# Patient Record
Sex: Male | Born: 1945 | Race: White | Hispanic: No
Health system: Southern US, Community
[De-identification: ages and names within clinical notes are randomized; demographics above are authoritative.]

## PROBLEM LIST (undated history)

## (undated) DIAGNOSIS — I10 Essential (primary) hypertension: Secondary | ICD-10-CM

## (undated) DIAGNOSIS — G4730 Sleep apnea, unspecified: Secondary | ICD-10-CM

## (undated) DIAGNOSIS — E785 Hyperlipidemia, unspecified: Secondary | ICD-10-CM

## (undated) DIAGNOSIS — E119 Type 2 diabetes mellitus without complications: Secondary | ICD-10-CM

## (undated) DIAGNOSIS — M1990 Unspecified osteoarthritis, unspecified site: Secondary | ICD-10-CM

## (undated) HISTORY — PX: SPINE SURGERY: SHX786

## (undated) HISTORY — DX: Unspecified osteoarthritis, unspecified site: M19.90

## (undated) HISTORY — DX: Sleep apnea, unspecified: G47.30

## (undated) HISTORY — PX: OTHER SURGICAL HISTORY: SHX169

## (undated) HISTORY — DX: Type 2 diabetes mellitus without complications: E11.9

## (undated) HISTORY — DX: Essential (primary) hypertension: I10

## (undated) HISTORY — DX: Hyperlipidemia, unspecified: E78.5

---

## 2002-12-08 HISTORY — PX: NASAL SEPTUM SURGERY: SHX37

## 2011-05-13 ENCOUNTER — Ambulatory Visit: Payer: Self-pay | Admitting: Family Medicine

## 2013-06-14 ENCOUNTER — Ambulatory Visit: Payer: Self-pay | Admitting: Family Medicine

## 2014-10-09 IMAGING — CT CT CHEST W/ CM
1 series · 15 of 32 positions shown, 19 images · non-contrast
Comparison: none

REASON FOR EXAM: Lung Nodule
COMMENTS:

[Series 2: soft tissue · axial · 0.79mm/px · z∈[-652,-318]mm · 15 of 126 slices shown, 19 images]
[im 10/126  mediastinal]
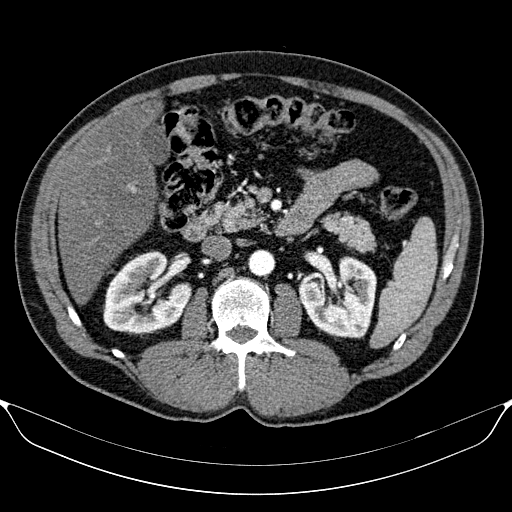
[im 10/126  lung]
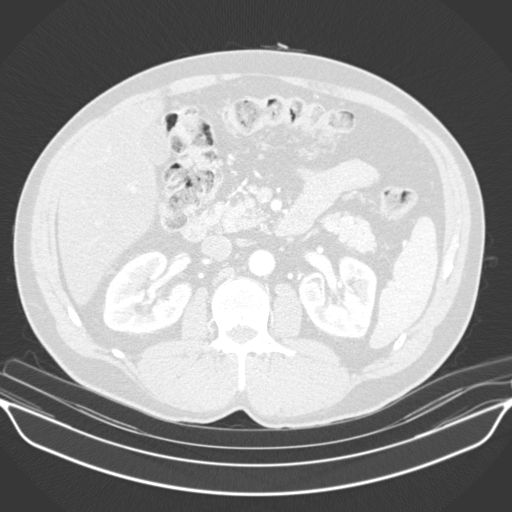
[im 19/126  lung]
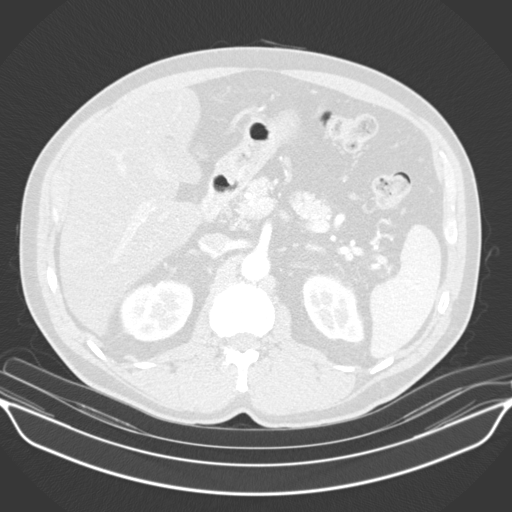
[im 26/126  lung]
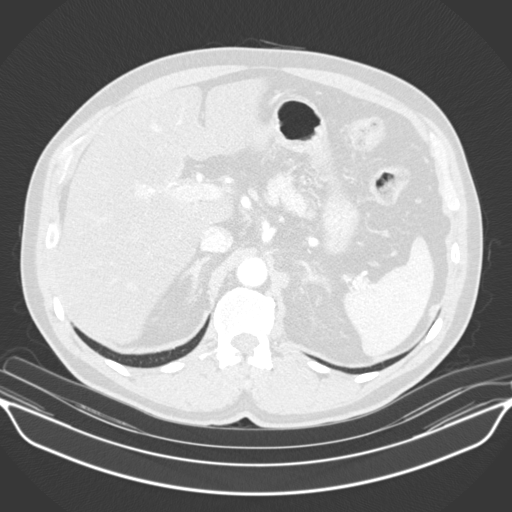
[im 33/126  lung]
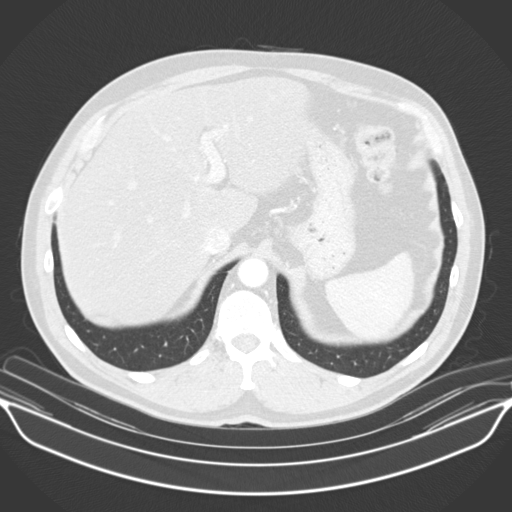
[im 42/126  mediastinal]
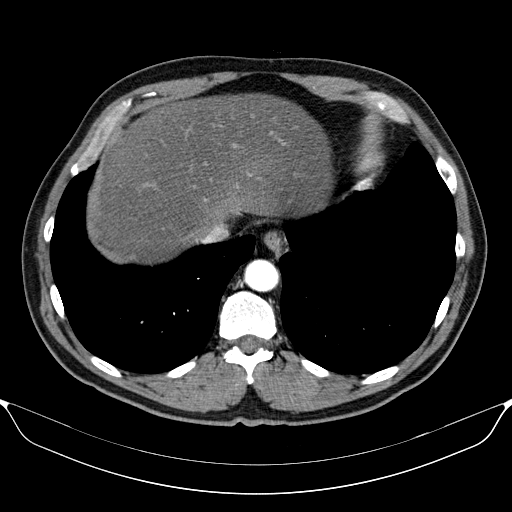
[im 42/126  lung]
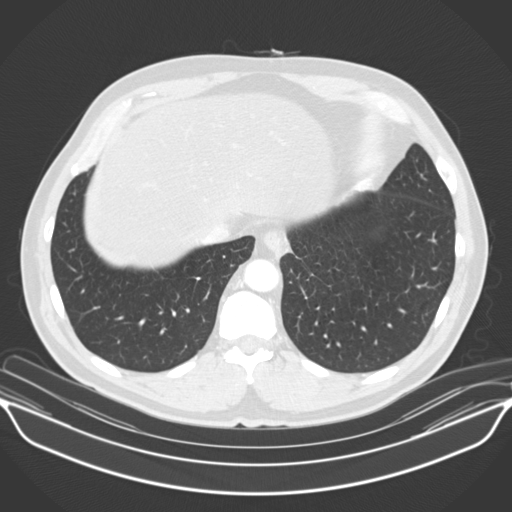
[im 51/126  lung]
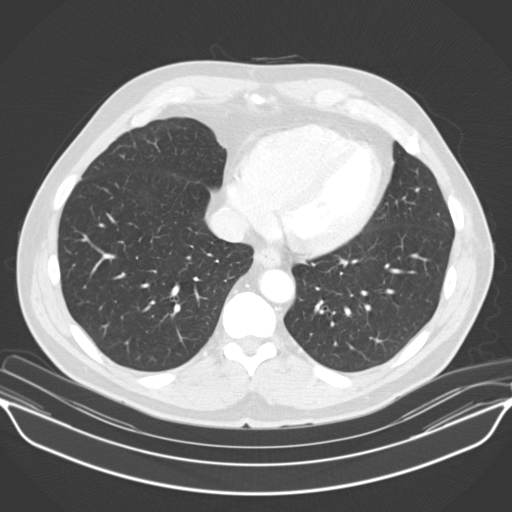
[im 61/126  lung]
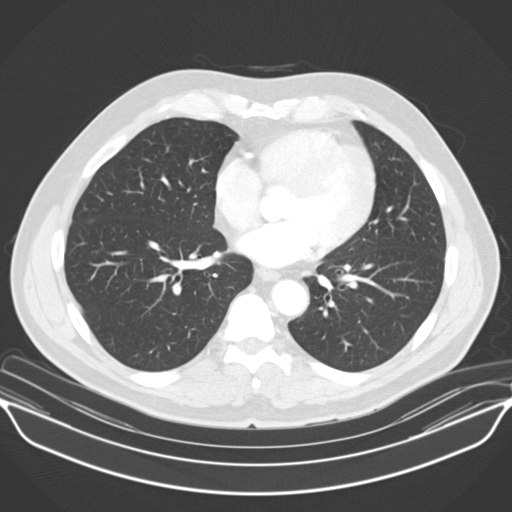
[im 66/126  lung]
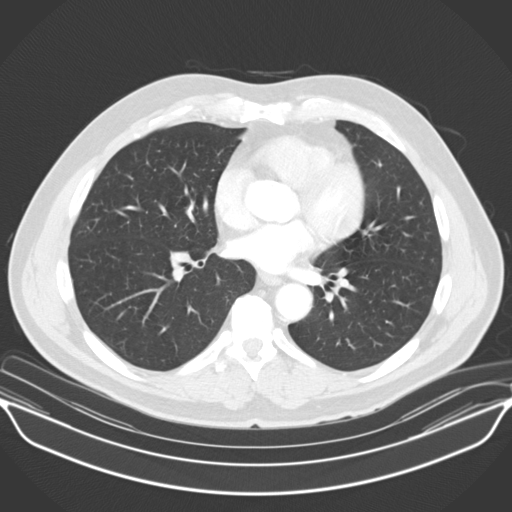
[im 75/126  mediastinal]
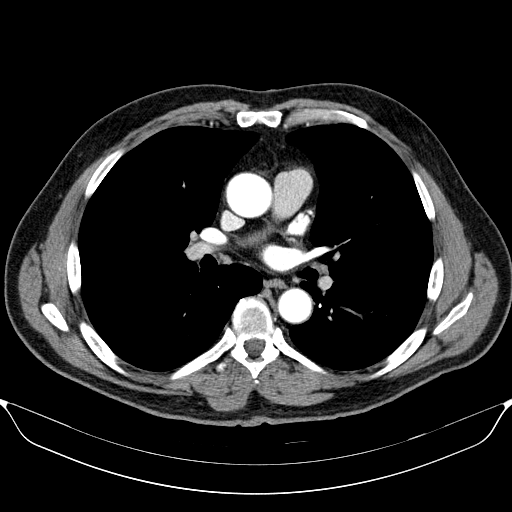
[im 75/126  lung]
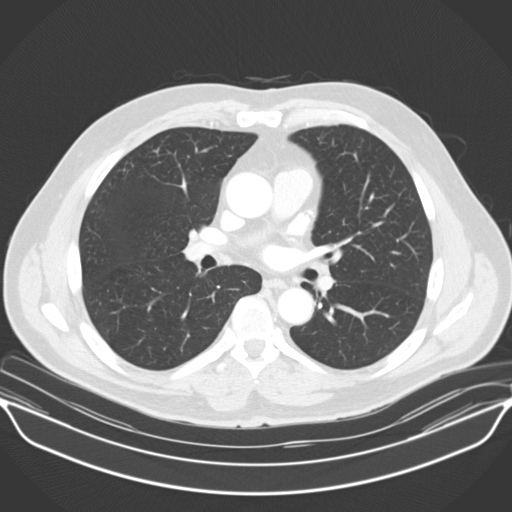
[im 79/126  lung]
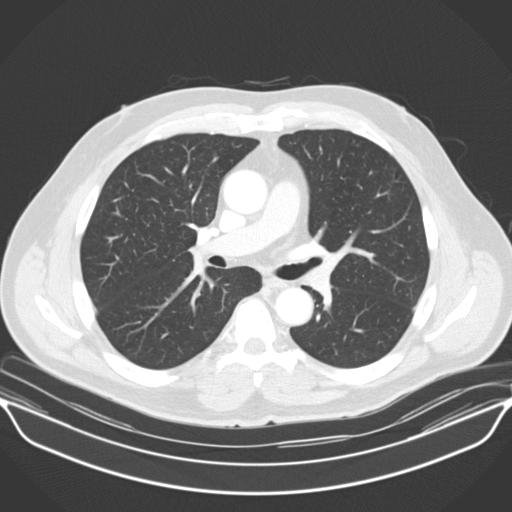
[im 88/126  lung]
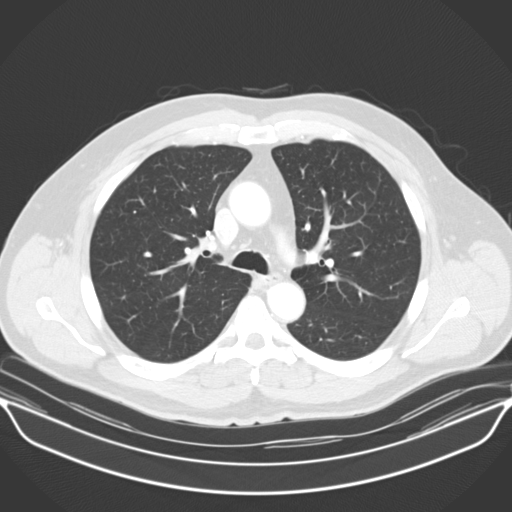
[im 98/126  lung]
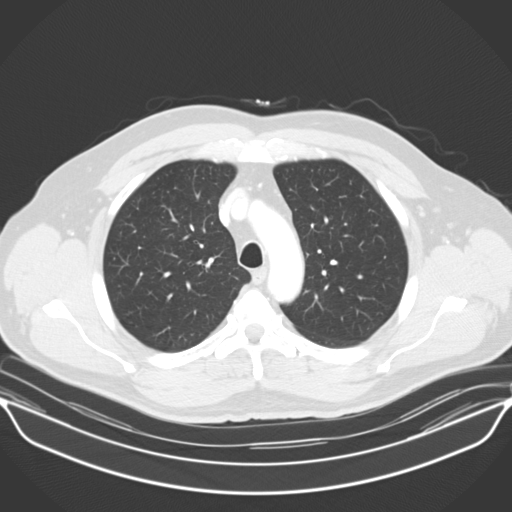
[im 102/126  mediastinal]
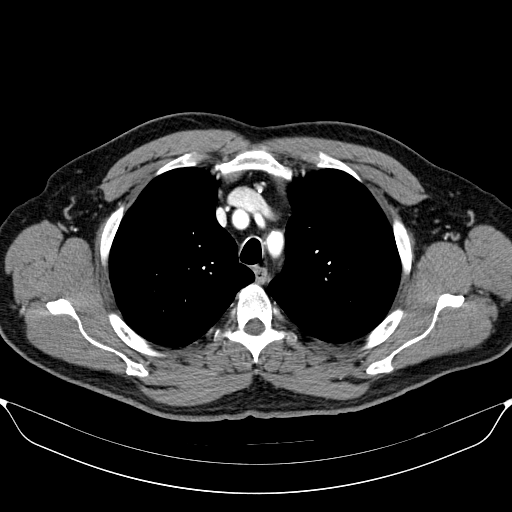
[im 102/126  lung]
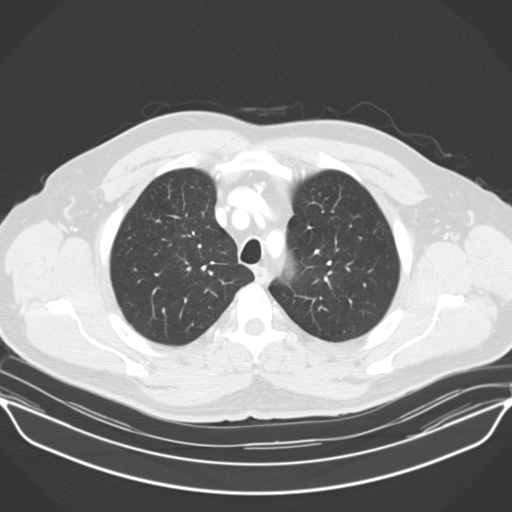
[im 112/126  lung]
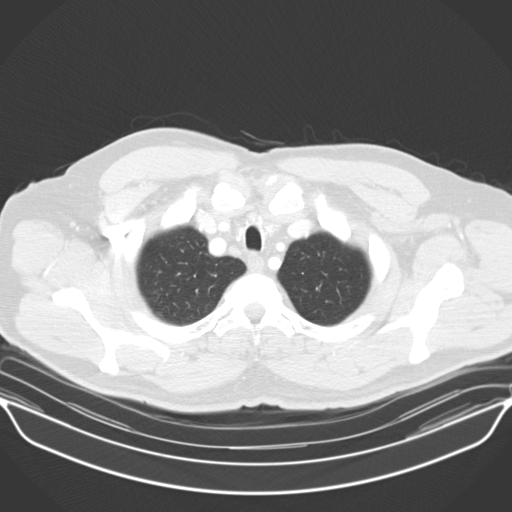
[im 121/126  lung]
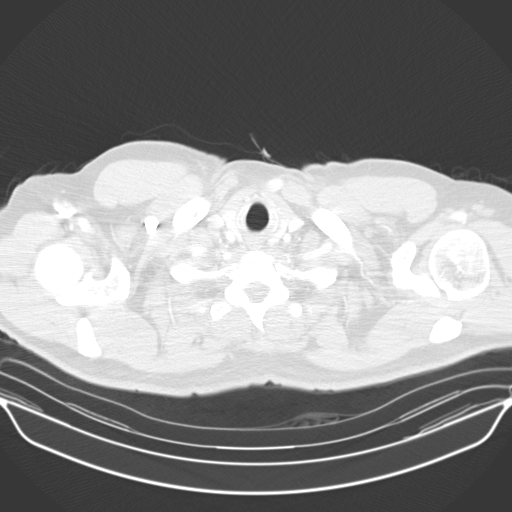

[15 of 32 positions shown; findings below may reference images not displayed]

PROCEDURE:     AL-BANTANI - AL-BANTANI CHEST WITH CONTRAST  - June 14, 2013 [DATE]

RESULT:     Chest CT is performed with 75 mL of [id number] iodinated
intravenous contrast with images reconstructed at 3.0 mm slice thickness in
the axial plane. The patient has no previous CT scans at this institution.
No previous outside studies were submitted for comparison.

There is diffuse fatty infiltration in the included portion of the liver.
The other included upper abdominal structures appear grossly unremarkable.
There is no pleural or pericardial effusion. The heart appears normal in
size. The thoracic aorta is normal in caliber without dissection. There is
scattered minimal atherosclerotic calcification within the aorta. Scattered
coronary artery atherosclerotic calcification is seen especially in the LAD.
There is no mediastinal or hilar mass or evidence of adenopathy. Nonenlarged
lymph nodes are seen in the mediastinum and axillary regions. The bony
structures appear unremarkable. Lung window images demonstrate no focal
pulmonary mass. There is some minimal nodular density peripherally in the
right lower lobe on image 81 of the lung window settings which is
nonspecific and not particularly nodular in appearance. This is also seen on
image 82. This measures up to approximately 3 mm at lung window settings. No
other focal pulmonary lesions are identified.
IMPRESSION: 1. There is minimal nodularity in the right lower lobe. It is uncertain if
this is stable given the lack of previous outside studies for comparison.
2. Fatty infiltration of the liver.

[REDACTED]

## 2015-05-21 ENCOUNTER — Other Ambulatory Visit: Payer: Self-pay | Admitting: Family Medicine

## 2015-05-21 DIAGNOSIS — R945 Abnormal results of liver function studies: Principal | ICD-10-CM

## 2015-05-21 DIAGNOSIS — R7989 Other specified abnormal findings of blood chemistry: Secondary | ICD-10-CM

## 2015-05-25 ENCOUNTER — Ambulatory Visit
Admission: RE | Admit: 2015-05-25 | Discharge: 2015-05-25 | Disposition: A | Discharge status: Home / Self Care | Payer: Medicare Other | Source: Ambulatory Visit | Attending: Family Medicine | Admitting: Family Medicine

## 2015-05-25 DIAGNOSIS — R7989 Other specified abnormal findings of blood chemistry: Secondary | ICD-10-CM | POA: Diagnosis not present

## 2015-05-25 DIAGNOSIS — R945 Abnormal results of liver function studies: Secondary | ICD-10-CM

## 2016-02-17 IMAGING — US US ABDOMEN LIMITED
1 series · 14 of 25 positions shown · non-contrast
Comparison: None.

CLINICAL DATA: Elevated LFTs.

EXAM:
US ABDOMEN LIMITED - RIGHT UPPER QUADRANT

[Series 1: us abdomen limited · 0.20mm/px · 14 of 53 slices shown]
[im 1/53]
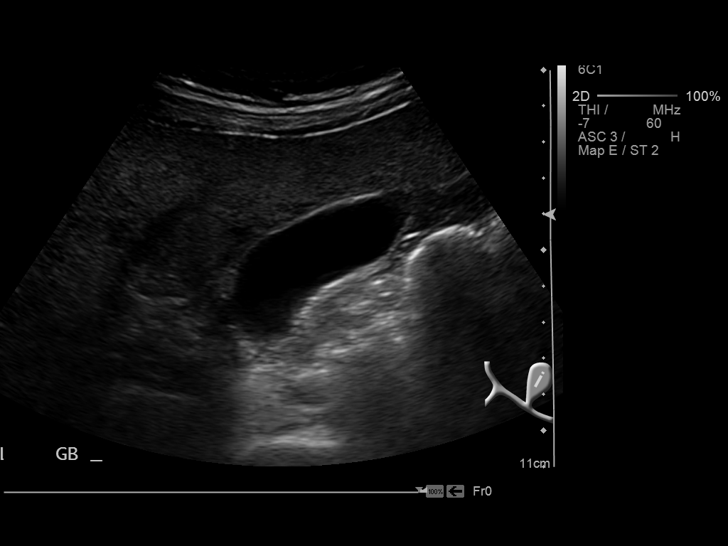
[im 5/53]
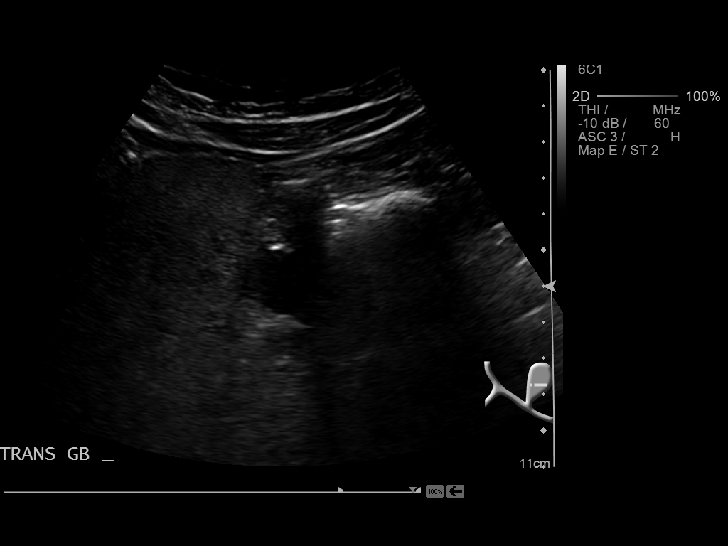
[im 9/53]
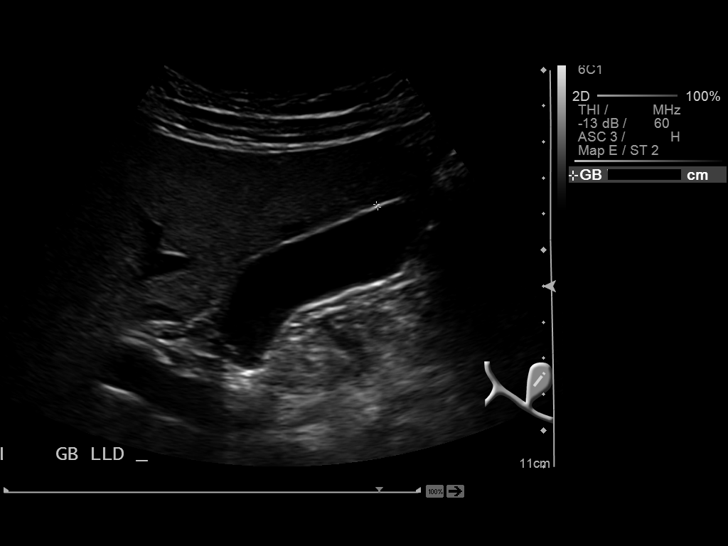
[im 14/53]
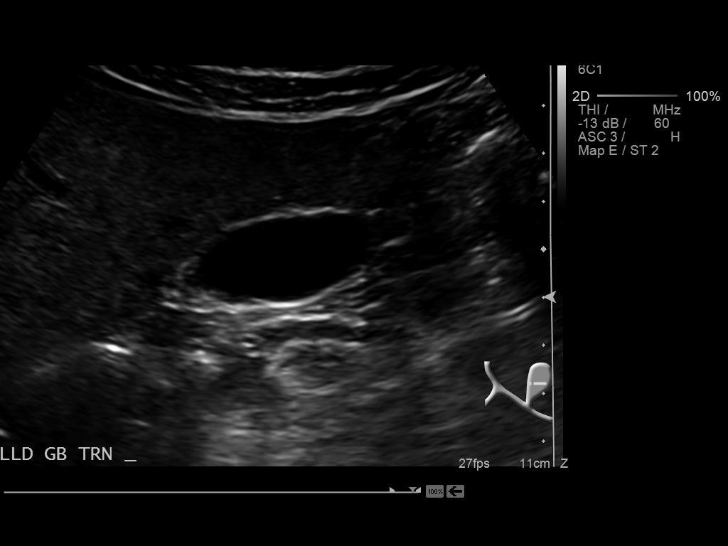
[im 18/53]
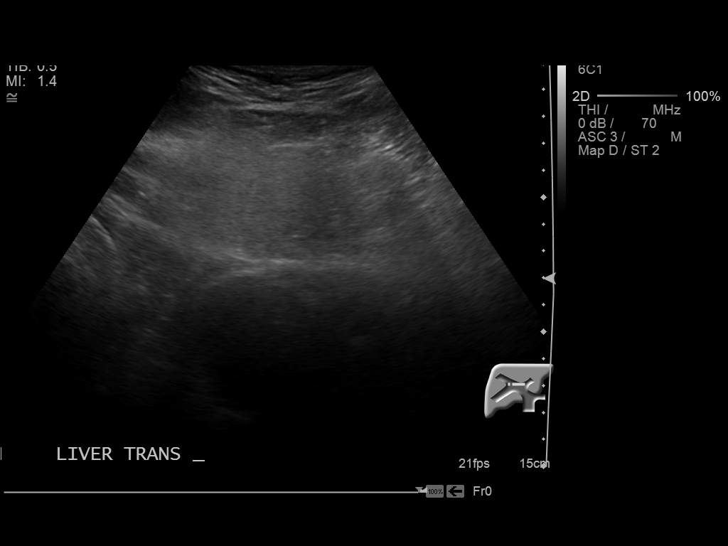
[im 20/53]
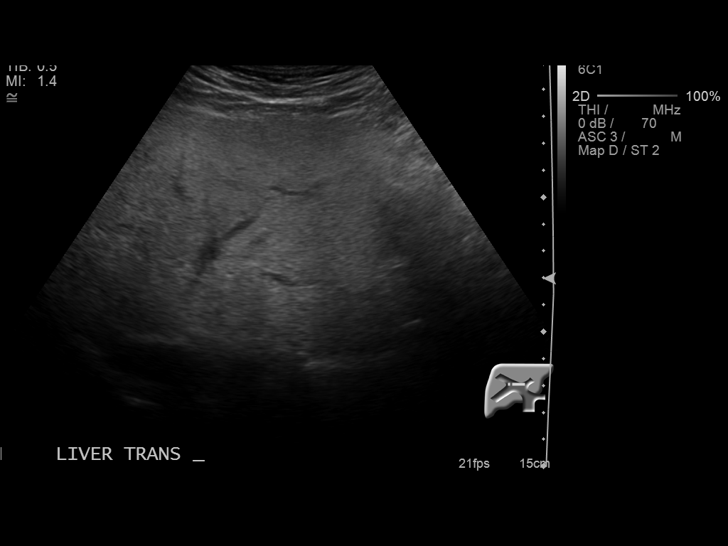
[im 24/53]
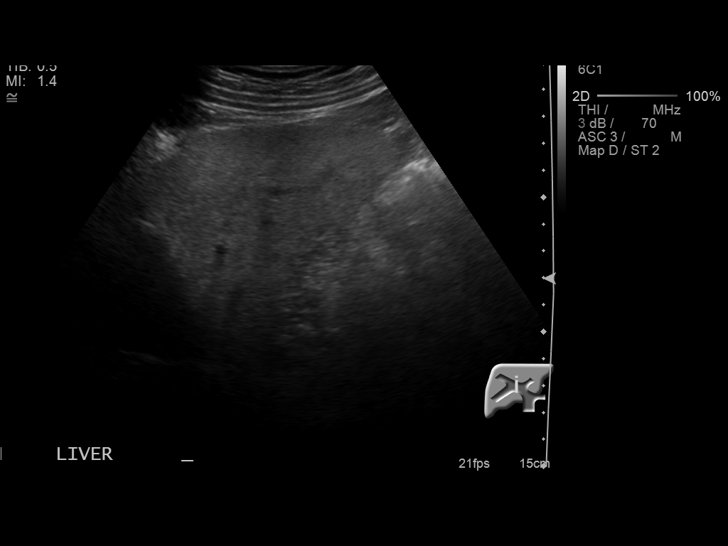
[im 29/53]
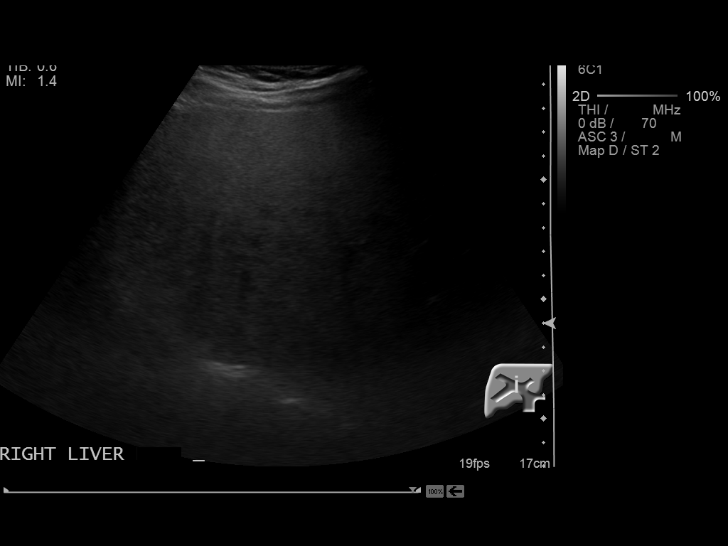
[im 33/53]
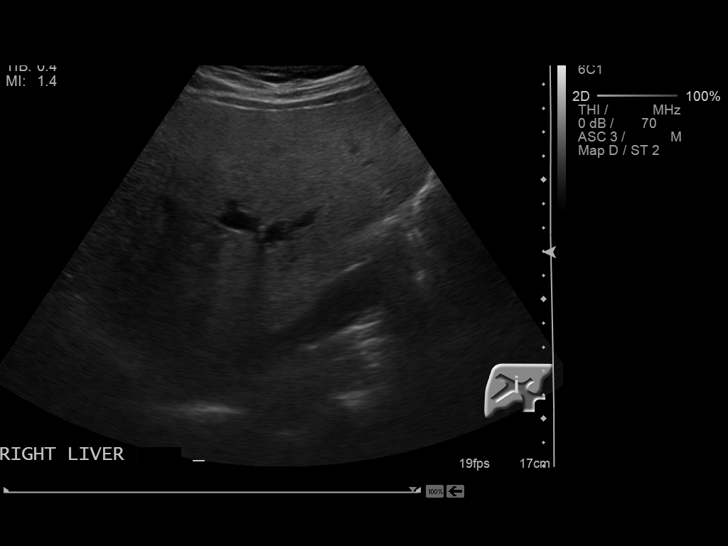
[im 35/53]
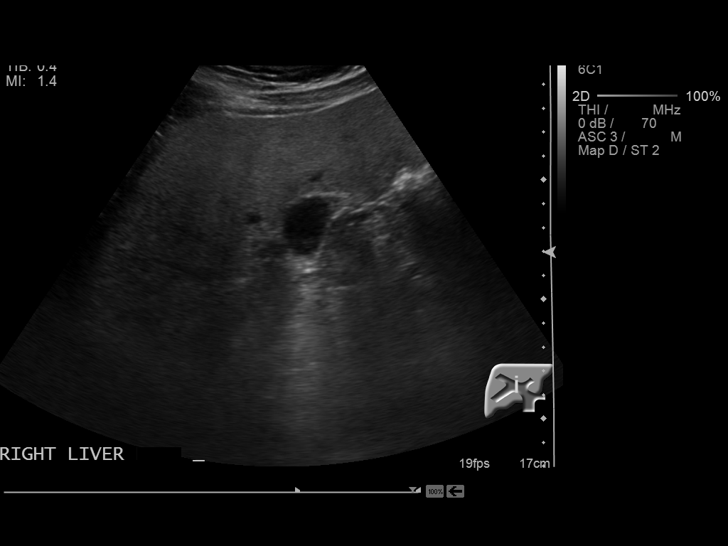
[im 40/53]
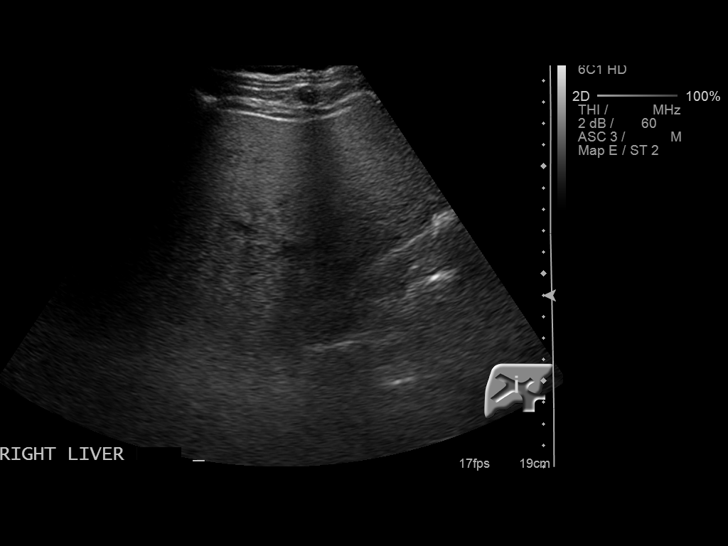
[im 44/53]
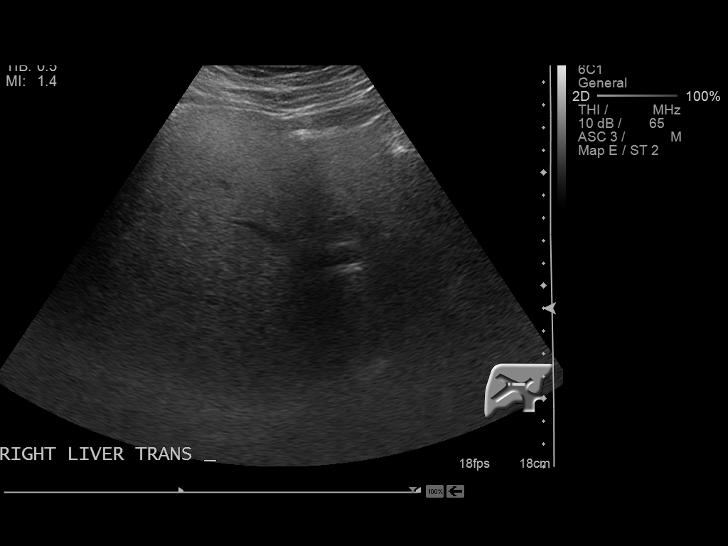
[im 48/53]
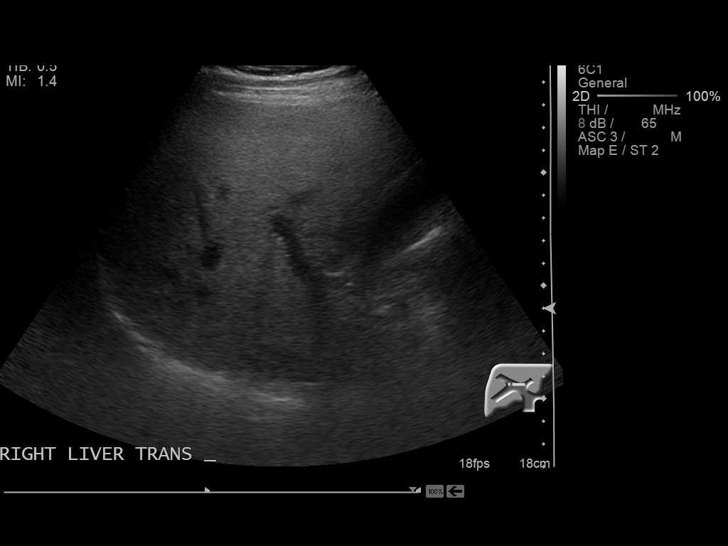
[im 53/53]
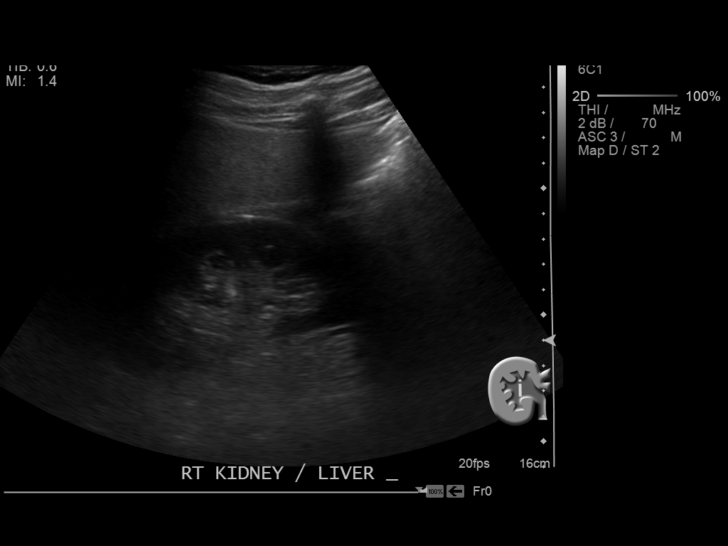

[14 of 25 positions shown; findings below may reference images not displayed]

FINDINGS: Gallbladder:

No gallstones or wall thickening visualized. No sonographic Murphy
sign noted.

Common bile duct:

Diameter: 2.6 mm

Liver:

Liver is echogenic consistent fatty infiltration and/or
hepatocellular disease. No focal hepatic abnormality identified.
IMPRESSION: Liver is echogenic consistent with fatty infiltration and/or
hepatocellular disease.

## 2016-12-15 ENCOUNTER — Encounter: Payer: Medicare Other | Attending: Family Medicine | Admitting: *Deleted

## 2016-12-15 ENCOUNTER — Encounter: Payer: Self-pay | Admitting: *Deleted

## 2016-12-15 VITALS — BP 158/96 | Ht 68.0 in | Wt 204.1 lb

## 2016-12-15 DIAGNOSIS — Z713 Dietary counseling and surveillance: Secondary | ICD-10-CM | POA: Diagnosis not present

## 2016-12-15 DIAGNOSIS — Z6831 Body mass index (BMI) 31.0-31.9, adult: Secondary | ICD-10-CM | POA: Diagnosis not present

## 2016-12-15 DIAGNOSIS — E119 Type 2 diabetes mellitus without complications: Secondary | ICD-10-CM | POA: Insufficient documentation

## 2016-12-15 NOTE — Progress Notes (Signed)
Diabetes Self-Management Education  Visit Type: First/Initial  Appt. Start Time: 0920 Appt. End Time: 1040  12/15/2016  [name], identified by name and date of birth, is a 71 y.o. male with a diagnosis of Diabetes: Type 2.   ASSESSMENT  Blood pressure (!) 158/96, height 5\' 8"  (1.727 m), weight 204 lb 1.6 oz (92.6 kg). Body mass index is 31.03 kg/m.      Diabetes Self-Management Education - 12/15/16 1358      Visit Information   Visit Type First/Initial     Initial Visit   Diabetes Type Type 2   Are you currently following a meal plan? No   Are you taking your medications as prescribed? Yes   Date Diagnosed 2 weeks ago     Health Coping   How would you rate your overall health? Good     Psychosocial Assessment   Patient Belief/Attitude about Diabetes Other (comment)  "concerned"   Self-care barriers None   Self-management support Doctor's office;Family   Other persons present Spouse/SO   Patient Concerns Nutrition/Meal planning;Medication;Glycemic Control;Weight Control;Healthy Lifestyle;Monitoring   Special Needs None   Preferred Learning Style Visual   Learning Readiness Ready   How often do you need to have someone help you when you read instructions, pamphlets, or other written materials from your doctor or pharmacy? 1 - Never   What is the last grade level you completed in school? BS     Pre-Education Assessment   Patient understands the diabetes disease and treatment process. Needs Instruction   Patient understands incorporating nutritional management into lifestyle. Needs Instruction   Patient undertands incorporating physical activity into lifestyle. Needs Instruction   Patient understands using medications safely. Needs Instruction   Patient understands monitoring blood glucose, interpreting and using results Needs Instruction   Patient understands prevention, detection, and treatment of acute complications. Needs Instruction   Patient  understands prevention, detection, and treatment of chronic complications. Needs Instruction   Patient understands how to develop strategies to address psychosocial issues. Needs Instruction   Patient understands how to develop strategies to promote health/change behavior. Needs Instruction     Complications   Last HgB A1C per patient/outside source 6.5 %  11/18/16   How often do you check your blood sugar? 0 times/day (not testing)  Provided One Touch Verio Flex meter and instructed on use. BG upon return demonstration was 111 mg/dL at 57:8410:25 am - fasting.    Have you had a dilated eye exam in the past 12 months? Yes   Have you had a dental exam in the past 12 months? No   Are you checking your feet? No     Dietary Intake   Breakfast bacon, eggs; cereal, toast, jam   Lunch [place] or bologna sandwich, chips, salas, macaroni salad or left-overs   Dinner shrimp, chicken, fish, with sweet potato, broccoli, carrots, salad, occasional spaghetti, pizza   Beverage(s) water, unsweetened coffee     Exercise   Exercise Type ADL's     Patient Education   Previous Diabetes Education No   Disease state  Definition of diabetes, type 1 and 2, and the diagnosis of diabetes   Nutrition management  Role of diet in the treatment of diabetes and the relationship between the three main macronutrients and blood glucose level   Physical activity and exercise  Role of exercise on diabetes management, blood pressure control and cardiac health.   Medications Reviewed patients medication for diabetes, action, purpose, timing of dose and side  effects.   Monitoring Taught/evaluated SMBG meter.;Purpose and frequency of SMBG.;Taught/discussed recording of test results and interpretation of SMBG.;Identified appropriate SMBG and/or A1C goals.   Chronic complications Relationship between chronic complications and blood glucose control   Psychosocial adjustment Identified and addressed patients feelings and concerns about  diabetes     Individualized Goals (developed by patient)   Reducing Risk Improve blood sugars Decrease medications Prevent diabetes complications Lose weight Lead a healthier lifestyle Become more fit     Outcomes   Expected Outcomes Demonstrated interest in learning. Expect positive outcomes      Individualized Plan for Diabetes Self-Management Training:   Learning Objective:  Patient will have a greater understanding of diabetes self-management. Patient education plan is to attend individual and/or group sessions per assessed needs and concerns.   Plan:   Patient Instructions  Check blood sugars 2 x day before breakfast and 2 hrs after supper every other day Bring blood sugar records to the next class Exercise:  Walk as tolerated  Eat 3 meals day, 1-2  snacks a day Space meals 4-6 hours apart Avoid sugar sweetened drinks (juices) Call your doctor for a prescription for:  1. Meter strips (type) One Touch Verio checking  1 time per day  2. Lancets (type) One Touch Delica checking  1     time per day   Expected Outcomes:  Demonstrated interest in learning. Expect positive outcomes  Education material provided:  General Meal Planning Guidelines Simple Meal Plan Meter = One Touch Verio Flex  If problems or questions, patient to contact team via:  Sharion Settler, RN, CCM, CDE [phone]  Future DSME appointment:  December 18, 2016 for Diabetes Class 1

## 2016-12-15 NOTE — Patient Instructions (Signed)
Check blood sugars 2 x day before breakfast and 2 hrs after supper every other day Bring blood sugar records to the next class  Exercise:  Walk as tolerated   Eat 3 meals day, 1-2  snacks a day Space meals 4-6 hours apart Avoid sugar sweetened drinks (juices)  Call your doctor for a prescription for:  1. Meter strips (type) One Touch Verio checking  1 time per day  2. Lancets (type) One Touch Delica checking  1     time per day  Return for classes on:

## 2016-12-18 ENCOUNTER — Encounter: Payer: Medicare Other | Admitting: Dietician

## 2016-12-18 ENCOUNTER — Encounter: Payer: Self-pay | Admitting: Dietician

## 2016-12-18 VITALS — Ht 68.0 in | Wt 200.7 lb

## 2016-12-18 DIAGNOSIS — E119 Type 2 diabetes mellitus without complications: Secondary | ICD-10-CM

## 2016-12-18 NOTE — Progress Notes (Signed)

## 2017-01-15 ENCOUNTER — Encounter: Payer: Self-pay | Admitting: *Deleted

## 2017-01-15 ENCOUNTER — Encounter: Payer: Medicare Other | Attending: Family Medicine | Admitting: *Deleted

## 2017-01-15 VITALS — Wt 193.2 lb

## 2017-01-15 DIAGNOSIS — Z713 Dietary counseling and surveillance: Secondary | ICD-10-CM | POA: Insufficient documentation

## 2017-01-15 DIAGNOSIS — Z6831 Body mass index (BMI) 31.0-31.9, adult: Secondary | ICD-10-CM | POA: Diagnosis not present

## 2017-01-15 DIAGNOSIS — E119 Type 2 diabetes mellitus without complications: Secondary | ICD-10-CM

## 2017-01-15 NOTE — Progress Notes (Signed)
Appt. Start Time: 0900 Appt. End Time: 1200  Class 2 Nutritional Management - identify sources of carbohydrate, protein and fat; plan balanced meals; estimate servings of carbohydrates in meals  Psychosocial - identify DM as a source of stress; state the effects of stress on BG control  Exercise - describe the effects of exercise on blood glucose and importance of regular exercise in controlling diabetes; state a plan for personal exercise; verbalize contraindications for exercise  Self-Monitoring - state importance of SMBG; use SMBG results to effectively manage diabetes; identify importance of regular HbA1C testing and goals for results  Acute Complications - recognize hyperglycemia and hypoglycemia with causes and effects; identify blood glucose results as high, low or in control; list steps in treating and preventing high and low blood glucose  Sick Day Guidelines: state appropriate measure to manage blood glucose when ill (need for meds, HBGM plan, when to call physician, need for fluids)  Chronic Complications/Foot, Skin, Eye Dental Care - identify possible long-term complications of diabetes (retinopathy, neuropathy, nephropathy, cardiovascular disease, infections); explain steps in prevention and treatment of chronic complications; state importance of daily self-foot exams; describe how to examine feet and what to look for; explain appropriate eye and dental care  Lifestyle Changes/Goals - state benefits of making appropriate lifestyle changes; identify habits that need to change (meals, tobacco, alcohol); identify strategies to reduce risk factors for personal health  Pregnancy/Sexual Health - state importance of good blood glucose control in preventing sexual problems (impotence, vaginal dryness, infections, loss of desire)  Teaching Materials Used: Class 2 Slide Packet A1C Pamphlet Foot Care Literature Kidney Test Handout Quick and "Balanced" Meal Ideas Carb Counting and Meal  Planning Book Goals for Class 2 

## 2017-01-22 ENCOUNTER — Encounter: Payer: Medicare Other | Admitting: Dietician

## 2017-01-22 ENCOUNTER — Encounter: Payer: Self-pay | Admitting: Dietician

## 2017-01-22 VITALS — Ht 68.0 in | Wt 196.3 lb

## 2017-01-22 DIAGNOSIS — E119 Type 2 diabetes mellitus without complications: Secondary | ICD-10-CM

## 2017-01-22 NOTE — Progress Notes (Signed)

## 2017-02-04 ENCOUNTER — Encounter: Payer: Self-pay | Admitting: *Deleted

## 2020-08-08 ENCOUNTER — Other Ambulatory Visit: Payer: Self-pay

## 2020-08-08 ENCOUNTER — Other Ambulatory Visit: Payer: Medicare Other

## 2020-08-08 DIAGNOSIS — Z20822 Contact with and (suspected) exposure to covid-19: Secondary | ICD-10-CM

## 2020-08-09 LAB — NOVEL CORONAVIRUS, NAA: SARS-CoV-2, NAA: NOT DETECTED

## 2021-06-05 ENCOUNTER — Encounter: Payer: Self-pay | Admitting: Dietician
